# Patient Record
Sex: Male | Born: 1965 | Race: White | Hispanic: No | Marital: Single | Smoking: Never smoker
Health system: Southern US, Community
[De-identification: ages and names within clinical notes are randomized; demographics above are authoritative.]

## PROBLEM LIST (undated history)

## (undated) DIAGNOSIS — E119 Type 2 diabetes mellitus without complications: Secondary | ICD-10-CM

## (undated) HISTORY — PX: ABDOMINAL SURGERY: SHX537

---

## 2003-07-02 ENCOUNTER — Encounter: Admission: RE | Admit: 2003-07-02 | Discharge: 2003-07-02 | Payer: Self-pay | Admitting: Surgery

## 2005-07-28 ENCOUNTER — Ambulatory Visit: Payer: Self-pay | Admitting: Cardiovascular Disease

## 2005-07-28 ENCOUNTER — Observation Stay (HOSPITAL_COMMUNITY): Admission: EM | Admit: 2005-07-28 | Discharge: 2005-07-28 | Payer: Self-pay | Admitting: Emergency Medicine

## 2005-07-28 ENCOUNTER — Ambulatory Visit: Payer: Self-pay | Admitting: Family Medicine

## 2005-07-28 ENCOUNTER — Ambulatory Visit: Payer: Self-pay

## 2005-08-05 ENCOUNTER — Ambulatory Visit: Payer: Self-pay | Admitting: Family Medicine

## 2006-06-06 ENCOUNTER — Ambulatory Visit: Payer: Self-pay | Admitting: Family Medicine

## 2006-06-17 ENCOUNTER — Ambulatory Visit: Payer: Self-pay | Admitting: Family Medicine

## 2006-06-20 ENCOUNTER — Ambulatory Visit: Payer: Self-pay | Admitting: Family Medicine

## 2006-10-13 DIAGNOSIS — K449 Diaphragmatic hernia without obstruction or gangrene: Secondary | ICD-10-CM | POA: Insufficient documentation

## 2006-10-13 DIAGNOSIS — K219 Gastro-esophageal reflux disease without esophagitis: Secondary | ICD-10-CM

## 2006-10-13 DIAGNOSIS — E669 Obesity, unspecified: Secondary | ICD-10-CM | POA: Insufficient documentation

## 2006-10-13 DIAGNOSIS — I10 Essential (primary) hypertension: Secondary | ICD-10-CM

## 2006-10-13 DIAGNOSIS — F5232 Male orgasmic disorder: Secondary | ICD-10-CM

## 2007-07-12 ENCOUNTER — Ambulatory Visit: Payer: Self-pay | Admitting: Family Medicine

## 2007-07-12 DIAGNOSIS — G43909 Migraine, unspecified, not intractable, without status migrainosus: Secondary | ICD-10-CM | POA: Insufficient documentation

## 2007-07-17 ENCOUNTER — Encounter (INDEPENDENT_AMBULATORY_CARE_PROVIDER_SITE_OTHER): Payer: Self-pay | Admitting: *Deleted

## 2007-07-17 ENCOUNTER — Ambulatory Visit: Payer: Self-pay | Admitting: Family Medicine

## 2007-07-17 LAB — CONVERTED CEMR LAB
BUN: 22 mg/dL (ref 6–23)
CO2: 29 meq/L (ref 19–32)
Calcium: 9.6 mg/dL (ref 8.4–10.5)
Chloride: 97 meq/L (ref 96–112)
Creatinine, Ser: 1.05 mg/dL (ref 0.40–1.50)
Glucose, Bld: 95 mg/dL (ref 70–99)
Potassium: 4.4 meq/L (ref 3.5–5.3)
Sodium: 139 meq/L (ref 135–145)

## 2007-08-08 ENCOUNTER — Encounter (INDEPENDENT_AMBULATORY_CARE_PROVIDER_SITE_OTHER): Payer: Self-pay | Admitting: *Deleted

## 2007-08-08 ENCOUNTER — Ambulatory Visit: Payer: Self-pay | Admitting: Sports Medicine

## 2007-08-08 LAB — CONVERTED CEMR LAB
ALT: 31 units/L (ref 0–53)
AST: 16 units/L (ref 0–37)
Albumin: 4.7 g/dL (ref 3.5–5.2)
Alkaline Phosphatase: 91 units/L (ref 39–117)
BUN: 19 mg/dL (ref 6–23)
CO2: 28 meq/L (ref 19–32)
Calcium: 9.9 mg/dL (ref 8.4–10.5)
Chloride: 99 meq/L (ref 96–112)
Cholesterol: 207 mg/dL — ABNORMAL HIGH (ref 0–200)
Creatinine, Ser: 0.94 mg/dL (ref 0.40–1.50)
Glucose, Bld: 118 mg/dL — ABNORMAL HIGH (ref 70–99)
HDL: 55 mg/dL (ref >39)
LDL Cholesterol: 118 mg/dL — ABNORMAL HIGH (ref 0–99)
Potassium: 4.6 meq/L (ref 3.5–5.3)
Sodium: 139 meq/L (ref 135–145)
Total Bilirubin: 0.5 mg/dL (ref 0.3–1.2)
Total CHOL/HDL Ratio: 3.8
Total Protein: 7.5 g/dL (ref 6.0–8.3)
Triglycerides: 169 mg/dL — ABNORMAL HIGH (ref <150)
VLDL: 34 mg/dL (ref 0–40)

## 2007-08-14 ENCOUNTER — Encounter (INDEPENDENT_AMBULATORY_CARE_PROVIDER_SITE_OTHER): Payer: Self-pay | Admitting: *Deleted

## 2007-09-08 ENCOUNTER — Telehealth (INDEPENDENT_AMBULATORY_CARE_PROVIDER_SITE_OTHER): Payer: Self-pay | Admitting: *Deleted

## 2010-10-05 ENCOUNTER — Encounter: Payer: Self-pay | Admitting: *Deleted
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
## 2014-09-23 ENCOUNTER — Other Ambulatory Visit (INDEPENDENT_AMBULATORY_CARE_PROVIDER_SITE_OTHER): Payer: Self-pay | Admitting: Surgery

## 2014-09-23 ENCOUNTER — Other Ambulatory Visit (INDEPENDENT_AMBULATORY_CARE_PROVIDER_SITE_OTHER): Payer: Self-pay

## 2014-09-23 DIAGNOSIS — K439 Ventral hernia without obstruction or gangrene: Secondary | ICD-10-CM

## 2014-09-24 ENCOUNTER — Other Ambulatory Visit (INDEPENDENT_AMBULATORY_CARE_PROVIDER_SITE_OTHER): Payer: Self-pay | Admitting: Surgery

## 2014-09-24 DIAGNOSIS — R1084 Generalized abdominal pain: Secondary | ICD-10-CM

## 2014-09-25 ENCOUNTER — Other Ambulatory Visit (INDEPENDENT_AMBULATORY_CARE_PROVIDER_SITE_OTHER): Payer: Self-pay

## 2014-09-25 ENCOUNTER — Other Ambulatory Visit (INDEPENDENT_AMBULATORY_CARE_PROVIDER_SITE_OTHER): Payer: Self-pay | Admitting: *Deleted

## 2014-09-25 DIAGNOSIS — R1084 Generalized abdominal pain: Secondary | ICD-10-CM

## 2014-10-03 ENCOUNTER — Ambulatory Visit
Admission: RE | Admit: 2014-10-03 | Discharge: 2014-10-03 | Disposition: A | Discharge status: Home / Self Care | Payer: Self-pay | Source: Ambulatory Visit | Attending: Surgery | Admitting: Surgery

## 2014-10-11 ENCOUNTER — Other Ambulatory Visit (INDEPENDENT_AMBULATORY_CARE_PROVIDER_SITE_OTHER): Payer: Self-pay | Admitting: Surgery

## 2014-10-15 ENCOUNTER — Other Ambulatory Visit (INDEPENDENT_AMBULATORY_CARE_PROVIDER_SITE_OTHER): Payer: Self-pay | Admitting: *Deleted

## 2014-10-15 DIAGNOSIS — R109 Unspecified abdominal pain: Secondary | ICD-10-CM

## 2014-10-17 ENCOUNTER — Ambulatory Visit
Admission: RE | Admit: 2014-10-17 | Discharge: 2014-10-17 | Disposition: A | Discharge status: Home / Self Care | Payer: BLUE CROSS/BLUE SHIELD | Source: Ambulatory Visit | Attending: Surgery | Admitting: Surgery

## 2014-10-17 HISTORY — DX: Type 2 diabetes mellitus without complications: E11.9

## 2014-10-17 MED ORDER — IOPAMIDOL (ISOVUE-300) INJECTION 61%
100.0000 mL | Freq: Once | INTRAVENOUS | Status: AC | PRN
  Administered 2014-10-17: 100 mL via INTRAVENOUS

## 2014-11-04 ENCOUNTER — Other Ambulatory Visit: Payer: Self-pay | Admitting: Surgery

## 2016-10-14 ENCOUNTER — Encounter (HOSPITAL_COMMUNITY): Payer: Self-pay

## 2016-10-14 ENCOUNTER — Observation Stay (HOSPITAL_COMMUNITY)
Admission: EM | Admit: 2016-10-14 | Discharge: 2016-10-15 | Disposition: A | Discharge status: Home / Self Care | Payer: BLUE CROSS/BLUE SHIELD | Attending: Internal Medicine | Admitting: Internal Medicine

## 2016-10-14 ENCOUNTER — Other Ambulatory Visit: Payer: Self-pay

## 2016-10-14 ENCOUNTER — Emergency Department (HOSPITAL_COMMUNITY): Payer: BLUE CROSS/BLUE SHIELD

## 2016-10-14 DIAGNOSIS — R0789 Other chest pain: Principal | ICD-10-CM | POA: Insufficient documentation

## 2016-10-14 DIAGNOSIS — E119 Type 2 diabetes mellitus without complications: Secondary | ICD-10-CM | POA: Diagnosis not present

## 2016-10-14 DIAGNOSIS — R079 Chest pain, unspecified: Secondary | ICD-10-CM

## 2016-10-14 DIAGNOSIS — R609 Edema, unspecified: Secondary | ICD-10-CM

## 2016-10-14 LAB — GLUCOSE, CAPILLARY: GLUCOSE-CAPILLARY: 166 mg/dL — AB (ref 65–99)

## 2016-10-14 LAB — CBC
HCT: 42.2 % (ref 39.0–52.0)
Hemoglobin: 13.6 g/dL (ref 13.0–17.0)
MCH: 25.8 pg — ABNORMAL LOW (ref 26.0–34.0)
MCHC: 32.2 g/dL (ref 30.0–36.0)
MCV: 79.9 fL (ref 78.0–100.0)
Platelets: 200 10*3/uL (ref 150–400)
RBC: 5.28 MIL/uL (ref 4.22–5.81)
RDW: 14.9 % (ref 11.5–15.5)
WBC: 7.4 10*3/uL (ref 4.0–10.5)

## 2016-10-14 LAB — BASIC METABOLIC PANEL
ANION GAP: 7 (ref 5–15)
Anion gap: 12 (ref 5–15)
BUN: 11 mg/dL (ref 6–20)
BUN: 12 mg/dL (ref 6–20)
CALCIUM: 9.4 mg/dL (ref 8.9–10.3)
CALCIUM: 9.2 mg/dL (ref 8.9–10.3)
CHLORIDE: 102 mmol/L (ref 101–111)
CO2: 26 mmol/L (ref 22–32)
CO2: 28 mmol/L (ref 22–32)
CREATININE: 0.91 mg/dL (ref 0.61–1.24)
Chloride: 103 mmol/L (ref 101–111)
Creatinine, Ser: 0.91 mg/dL (ref 0.61–1.24)
GFR calc Af Amer: >60 mL/min (ref >60)
GFR calc non Af Amer: >60 mL/min (ref >60)
GFR calc non Af Amer: >60 mL/min (ref >60)
GLUCOSE: 115 mg/dL — AB (ref 65–99)
Glucose, Bld: 118 mg/dL — ABNORMAL HIGH (ref 65–99)
POTASSIUM: 3.8 mmol/L (ref 3.5–5.1)
Potassium: 3.8 mmol/L (ref 3.5–5.1)
SODIUM: 140 mmol/L (ref 135–145)
Sodium: 138 mmol/L (ref 135–145)

## 2016-10-14 LAB — I-STAT TROPONIN, ED
TROPONIN I, POC: 0 ng/mL (ref 0.00–0.08)
Troponin i, poc: 0 ng/mL (ref 0.00–0.08)

## 2016-10-14 LAB — D-DIMER, QUANTITATIVE: D-Dimer, Quant: 1.48 ug/mL-FEU — ABNORMAL HIGH (ref 0.00–0.50)

## 2016-10-14 LAB — TROPONIN I: Troponin I: <0.03 ng/mL (ref <0.03)

## 2016-10-14 LAB — TSH: TSH: 2.419 u[IU]/mL (ref 0.350–4.500)

## 2016-10-14 LAB — PROTIME-INR
INR: 1
Prothrombin Time: 13.2 seconds (ref 11.4–15.2)

## 2016-10-14 LAB — APTT: aPTT: 28 seconds (ref 24–36)

## 2016-10-14 MED ORDER — ACETAMINOPHEN 325 MG PO TABS: 650.0000 mg | ORAL_TABLET | ORAL | Status: DC | PRN

## 2016-10-14 MED ORDER — INSULIN ASPART 100 UNIT/ML ~~LOC~~ SOLN: 0.0000 [IU] | Freq: Three times a day (TID) | SUBCUTANEOUS | Status: DC

## 2016-10-14 MED ORDER — ASPIRIN 81 MG PO CHEW
81.0000 mg | CHEWABLE_TABLET | Freq: Every day | ORAL | Status: DC
  Administered 2016-10-15: 81 mg via ORAL
  Filled 2016-10-14: qty 1

## 2016-10-14 MED ORDER — ENOXAPARIN SODIUM 40 MG/0.4ML ~~LOC~~ SOLN
40.0000 mg | SUBCUTANEOUS | Status: DC
  Administered 2016-10-14: 40 mg via SUBCUTANEOUS
  Filled 2016-10-14: qty 0.4

## 2016-10-14 MED ORDER — ASPIRIN 81 MG PO CHEW
324.0000 mg | CHEWABLE_TABLET | Freq: Once | ORAL | Status: AC
  Administered 2016-10-14: 324 mg via ORAL
  Filled 2016-10-14: qty 4

## 2016-10-14 MED ORDER — PANTOPRAZOLE SODIUM 40 MG PO TBEC
40.0000 mg | DELAYED_RELEASE_TABLET | Freq: Every day | ORAL | Status: DC
  Administered 2016-10-15: 40 mg via ORAL
  Filled 2016-10-14: qty 1

## 2016-10-14 MED ORDER — NITROGLYCERIN 0.4 MG SL SUBL: 0.4000 mg | SUBLINGUAL_TABLET | SUBLINGUAL | Status: DC | PRN

## 2016-10-14 NOTE — ED Triage Notes (Signed)
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 Pt rates the pain a 3/10. Pt took 2 ASA at 8 am and 1 Nitro prior to ems arrival. Pt denies any SOB, dizziness or nausea. VSS NAD at this time.

## 2016-10-14 NOTE — ED Provider Notes (Signed)
[facility]EMERGENCY DEPT Provider Note   CSN: [id number] Arrival date & time: 10/14/16  1522     History   Chief Complaint Chief Complaint  Patient presents with  . Chest Pain    HPI Gregory Glover is a 51 y.o. male.  HPI Patient presents to the emergency room with complaints of chest pain. Patient was at work this morning. He started developing pain in the left side of his chest about 8 AM. It radiated to his left arm. It lasted for several hours this morning and then gradually resolved. Patient did have some nausea with it. She denied feeling short of breath or diaphoretic.  he states she's not having any pain anymore although does have some pressure-type discomfort in his chest.  No trouble with any leg swelling. No history of PE or DVT. No history of coronary artery disease. He does not smoke. Past Medical History:  Diagnosis Date  . Diabetes [facility])     Patient Active Problem List   Diagnosis Date Noted  . MIGRAINE UNSP W/O INTRACT W/O STATUS MIGRAINOSUS 07/12/2007  . OBESITY, NOS 10/13/2006  . ERECTILE DYSFUNCTION 10/13/2006  . HYPERTENSION, BENIGN SYSTEMIC 10/13/2006  . GASTROESOPHAGEAL REFLUX, NO ESOPHAGITIS 10/13/2006  . HERNIA, HIATAL, NONCONGENITAL 10/13/2006    Past Surgical History:  Procedure Laterality Date  . ABDOMINAL SURGERY     "back in the 90s"       Home Medications    Prior to Admission medications   Medication Sig Start Date End Date Taking? Authorizing Provider  diclofenac (VOLTAREN) 75 MG EC tablet Take 75 mg by mouth 2 (two) times daily as needed.      Historical Provider, MD  lisinopril-hydrochlorothiazide (PRINZIDE,ZESTORETIC) 20-12.5 MG per tablet Take 1 tablet by mouth daily.      Historical Provider, MD  pantoprazole (PROTONIX) 40 MG tablet Take 40 mg by mouth daily.      Historical Provider, MD  SUMAtriptan (IMITREX) 100 MG tablet Take 100 mg by mouth every 2 (two) hours as needed. Max 200 mg     Historical Provider, MD    Family  History No family history on file.  Social History Social History  Substance Use Topics  . Smoking status: Never Smoker  . Smokeless tobacco: Never Used  . Alcohol use Yes     Comment: on the weekends     Allergies   Codeine   Review of Systems Review of Systems  All other systems reviewed and are negative.    Physical Exam Updated Vital Signs BP 144/93 (BP Location: Right Arm)   Pulse 84   Temp 97.9 F (36.6 C) (Oral)   Resp 20   Ht 6\' 3"  (1.905 m)   Wt 136.1 kg   SpO2 99%   BMI 37.50 kg/m   Physical Exam  Constitutional: He appears well-developed and well-nourished. No distress.  HENT:  Head: Normocephalic and atraumatic.  Right Ear: External ear normal.  Left Ear: External ear normal.  Eyes: Conjunctivae are normal. Right eye exhibits no discharge. Left eye exhibits no discharge. No scleral icterus.  Neck: Neck supple. No tracheal deviation present.  Cardiovascular: Normal rate, regular rhythm and intact distal pulses.   Pulmonary/Chest: Effort normal and breath sounds normal. No stridor. No respiratory distress. He has no wheezes. He has no rales.  Abdominal: Soft. Bowel sounds are normal. He exhibits no distension. There is no tenderness. There is no rebound and no guarding.  Musculoskeletal: He exhibits no edema or tenderness.  Neurological: He is  alert. He has normal strength. No cranial nerve deficit (no facial droop, extraocular movements intact, no slurred speech) or sensory deficit. He exhibits normal muscle tone. He displays no seizure activity. Coordination normal.  Skin: Skin is warm and dry. No rash noted.  Psychiatric: He has a normal mood and affect.  Nursing note and vitals reviewed.    ED Treatments / Results  Labs (all labs ordered are listed, but only abnormal results are displayed) Labs Reviewed  CBC - Abnormal; Notable for the following:       Result Value   MCH 25.8 (*)    All other components within normal limits  PROTIME-INR    APTT  BASIC METABOLIC PANEL  D-DIMER, QUANTITATIVE (NOT AT [facility])  Rosezena SensorI-STAT TROPOININ, ED    EKG  EKG Interpretation  Date/Time:  Thursday October 14 2016 15:23:41 EST Ventricular Rate:  81 PR Interval:  122 QRS Duration: 98 QT Interval:  374 QTC Calculation: 434 R Axis:   104 Text Interpretation:  Normal sinus rhythm Rightward axis T wave abnormality, consider inferior ischemia Abnormal ECG T wave changes in aVF and III are new compared to last tracing Confirmed by Leeta Grimme  MD-J, Vertis Bauder ([id number]) on 10/14/2016 3:30:20 PM       Radiology Dg Chest 2 View  Result Date: 10/14/2016 CLINICAL DATA:  Central chest pain radiating to the left shoulder for several hours EXAM: CHEST  2 VIEW COMPARISON:  07/27/2005 FINDINGS: Cardiac shadow is stable. Persistent scarring is noted in the left lung base. Multiple calcified granulomas are again seen and stable. No focal infiltrate or sizable effusion is noted. No bony abnormality is seen. IMPRESSION: Chronic changes as described above without acute abnormality. Electronically Signed   By: Alcide CleverMark  Lukens M.D.   On: 10/14/2016 15:55    Procedures Procedures (including critical care time)  Medications Ordered in ED Medications  nitroGLYCERIN (NITROSTAT) SL tablet 0.4 mg (not administered)  aspirin chewable tablet 324 mg (324 mg Oral Given 10/14/16 1634)     Initial Impression / Assessment and Plan / ED Course  I have reviewed the triage vital signs and the nursing notes.  Pertinent labs & imaging results that were available during my care of the patient were reviewed by me and considered in my medical decision making (see chart for details).  Clinical Course as of Oct 15 1707  Thu Oct 14, 2016  1708 D/w Dr Blake DivineAkula.  Will admit for cardiac rule out  [JK]    Clinical Course User Index [JK] Linwood DibblesJon Zaidy Absher, MD    Pt presents to the ED With chest pain concerning for unstable angina. Patient does have diabetes and high cholesterol. He has EKG changes that are new  compared to old EKG on file from 2006. Plan on admission to hospital for cardiac rule out.   Final Clinical Impressions(s) / ED Diagnoses   Final diagnoses:  Chest pain, unspecified type    New Prescriptions New Prescriptions   No medications on file     Linwood DibblesJon Remell Giaimo, MD 10/14/16 1710

## 2016-10-14 NOTE — H&P (Signed)
History and Physical    Gregory Glover ZOX:[id number] DOB: 10-Sep-1965 DOA: 10/14/2016  PCP: Vivien Presto, MD  Patient coming from: Home.   I have personally briefly reviewed patient's old medical records in [facility]  Chief Complaint: left sided chest pain.   HPI: Gregory Glover is a 51 y.o. male with medical history significant of diabetes mellitus and hyperlipidemia, presents to ED for left sided chest pain, while he was working earlier this am,. The pain is intermittent , radiated to the left arm, resolved after taking 2 aspirin. He reports nausea and some dizziness. He also reports intermittent tingling of both hands. He denies sob, cough, fever and chills. He reports similar complaints in the past but not as severe as this episode. He denies any other complaints. No headache, abdominal pain or diarrhea. On arrival to ED, he underwent cbc, CXR which were unremarkable.  BMP is pending. Point of care troponin is negative.  He was referred to medical service for evaluation of ACS, in view of his h/o of DM and hyperlipidemia.  Review of Systems: As per HPI otherwise 10 point review of systems negative.    Past Medical History:  Diagnosis Date  . Diabetes [facility])     Past Surgical History:  Procedure Laterality Date  . ABDOMINAL SURGERY     "back in the 90s"     reports that he has never smoked. He has never used smokeless tobacco. He reports that he drinks alcohol. He reports that he does not use drugs.  Allergies  Allergen Reactions  . Codeine     Shakes and nervousness    Family history of stroke  And CAD, AND arrhythmias in father at the age of 55's.   Prior to Admission medications   Medication Sig Start Date End Date Taking? Authorizing Provider  amLODipine (NORVASC) 10 MG tablet Take 10 mg by mouth daily.   Yes Historical Provider, MD  metFORMIN (GLUCOPHAGE) 1000 MG tablet Take 1,000 mg by mouth 2 (two) times daily with a meal.   Yes Historical  Provider, MD  pantoprazole (PROTONIX) 40 MG tablet Take 40 mg by mouth daily.     Yes Historical Provider, MD    Physical Exam: Vitals:   10/14/16 1522 10/14/16 1526 10/14/16 1532 10/14/16 1717  BP:   144/93 142/94  Pulse:   84 77  Resp:   20 20  Temp:   97.9 F (36.6 C)   TempSrc:   Oral   SpO2: 96%  99% 97%  Weight:  136.1 kg (300 lb)    Height:  6\' 3"  (1.905 m)      Constitutional: NAD, calm, comfortable Vitals:   10/14/16 1522 10/14/16 1526 10/14/16 1532 10/14/16 1717  BP:   144/93 142/94  Pulse:   84 77  Resp:   20 20  Temp:   97.9 F (36.6 C)   TempSrc:   Oral   SpO2: 96%  99% 97%  Weight:  136.1 kg (300 lb)    Height:  6\' 3"  (1.905 m)     Eyes: PERRL, lids and conjunctivae normal ENMT: Mucous membranes are moist. Posterior pharynx clear of any exudate or lesions.Normal dentition.  Neck: normal, supple, no masses, no thyromegaly Respiratory: clear to auscultation bilaterally, no wheezing, no crackles. Normal respiratory effort. No accessory muscle use.  Cardiovascular: Regular rate and rhythm, no murmurs / rubs / gallops. No extremity edema. 2+ pedal pulses. No carotid bruits.  Abdomen: no tenderness, no masses palpated. No  hepatosplenomegaly. Bowel sounds positive.  Musculoskeletal: no clubbing / cyanosis. No joint deformity upper and lower extremities. Good ROM, no contractures. Normal muscle tone.  Skin: no rashes, lesions, ulcers. No induration Neurologic: CN 2-12 grossly intact. Sensation intact, DTR normal. Strength 5/5 in all 4.  Psychiatric: Normal judgment and insight. Alert and oriented x 3. Normal mood.     Labs on Admission: I have personally reviewed following labs and imaging studies  CBC:  Recent Labs Lab 10/14/16 1620  WBC 7.4  HGB 13.6  HCT 42.2  MCV 79.9  PLT 200   Basic Metabolic Panel: No results for input(s): NA, K, CL, CO2, GLUCOSE, BUN, CREATININE, CALCIUM, MG, PHOS in the last 168 hours. GFR: CrCl cannot be calculated  (Patient's most recent lab result is older than the maximum 21 days allowed.). Liver Function Tests: No results for input(s): AST, ALT, ALKPHOS, BILITOT, PROT, ALBUMIN in the last 168 hours. No results for input(s): LIPASE, AMYLASE in the last 168 hours. No results for input(s): AMMONIA in the last 168 hours. Coagulation Profile:  Recent Labs Lab 10/14/16 1620  INR 1.00   Cardiac Enzymes: No results for input(s): CKTOTAL, CKMB, CKMBINDEX, TROPONINI in the last 168 hours. BNP (last 3 results) No results for input(s): PROBNP in the last 8760 hours. HbA1C: No results for input(s): HGBA1C in the last 72 hours. CBG: No results for input(s): GLUCAP in the last 168 hours. Lipid Profile: No results for input(s): CHOL, HDL, LDLCALC, TRIG, CHOLHDL, LDLDIRECT in the last 72 hours. Thyroid Function Tests: No results for input(s): TSH, T4TOTAL, FREET4, T3FREE, THYROIDAB in the last 72 hours. Anemia Panel: No results for input(s): VITAMINB12, FOLATE, FERRITIN, TIBC, IRON, RETICCTPCT in the last 72 hours. Urine analysis: No results found for: COLORURINE, APPEARANCEUR, LABSPEC, PHURINE, GLUCOSEU, HGBUR, BILIRUBINUR, KETONESUR, PROTEINUR, UROBILINOGEN, NITRITE, LEUKOCYTESUR  Radiological Exams on Admission: Dg Chest 2 View  Result Date: 10/14/2016 CLINICAL DATA:  Central chest pain radiating to the left shoulder for several hours EXAM: CHEST  2 VIEW COMPARISON:  07/27/2005 FINDINGS: Cardiac shadow is stable. Persistent scarring is noted in the left lung base. Multiple calcified granulomas are again seen and stable. No focal infiltrate or sizable effusion is noted. No bony abnormality is seen. IMPRESSION: Chronic changes as described above without acute abnormality. Electronically Signed   By: Alcide CleverMark  Lukens M.D.   On: 10/14/2016 15:55    EKG: sinus rhythm with t wave inversions in lead III and avf.   Assessment/Plan Active Problems:   Chest pain   Left sided chest pain with left shoulder  numbness : Admitted to telemetry for evaluation of ACS.  Serial enzymes, follow up EKG. Echocardiogram ordered.  Cardiology consult to see if he needs a stress test.  Lipid panel in am.   Diabetes Mellitus: CBG (last 3)  No results for input(s): GLUCAP in the last 72 hours.  Start SSI.  Hgba1c,  Hold metformin.   Hyperlipidemia:  Get lipid panel in am   GERD: Continue with protonix.    DVT prophylaxis: lovenox.  Code Status: full code.  Family Communication: family at bedside.  Disposition Plan: pending further eval.  Consults called: cardiology consult in am. Admission status: obs telemetry   Kamauri Kathol MD [facility] Pager [phone]  If 7PM-7AM, please contact night-coverage [email or web address] Password [email or web address]  10/14/2016, 7:14 PM

## 2016-10-15 ENCOUNTER — Observation Stay (HOSPITAL_BASED_OUTPATIENT_CLINIC_OR_DEPARTMENT_OTHER): Payer: BLUE CROSS/BLUE SHIELD

## 2016-10-15 ENCOUNTER — Observation Stay (HOSPITAL_COMMUNITY): Payer: BLUE CROSS/BLUE SHIELD

## 2016-10-15 DIAGNOSIS — R079 Chest pain, unspecified: Secondary | ICD-10-CM

## 2016-10-15 DIAGNOSIS — R609 Edema, unspecified: Secondary | ICD-10-CM | POA: Diagnosis not present

## 2016-10-15 DIAGNOSIS — R072 Precordial pain: Secondary | ICD-10-CM | POA: Diagnosis not present

## 2016-10-15 DIAGNOSIS — R0789 Other chest pain: Secondary | ICD-10-CM

## 2016-10-15 LAB — ECHOCARDIOGRAM COMPLETE
AV Area VTI: 4.82 cm2
AV Mean grad: 3 mmHg
AV Peak grad: 6 mmHg
AV area mean vel ind: 1.94 cm2/m2
AV peak Index: 1.87
AV vel: 4.99
AVA: 4.99 cm2
AVAREAMEANV: 5 cm2
AVAREAVTIIND: 1.93 cm2/m2
AVCELMEANRAT: 0.87
AVPKVEL: 118 cm/s
Ao pk vel: 0.84 m/s
CHL CUP MV DEC (S): 208
DOP CAL AO MEAN VELOCITY: 75.4 cm/s
E/e' ratio: 7.27
EWDT: 208 ms
FS: 39 % (ref 28–44)
Height: 75 in
IV/PV OW: 1.1
LA ID, A-P, ES: 41 mm
LA diam end sys: 41 mm
LA diam index: 1.59 cm/m2
LA vol: 49.9 mL
LAVOLA4C: 41.9 mL
LAVOLIN: 19.3 mL/m2
LV E/e' medial: 7.27
LV E/e'average: 7.27
LV PW d: 12.1 mm — AB (ref 0.6–1.1)
LV TDI E'LATERAL: 10.6
LV TDI E'MEDIAL: 8.18
LV e' LATERAL: 10.6 cm/s
LVOT VTI: 20.3 cm
LVOT area: 5.73 cm2
LVOTD: 27 mm
LVOTPV: 99.2 cm/s
LVOTSV: 116 mL
LVOTVTI: 0.87 cm
MV pk A vel: 76 m/s
MV pk E vel: 77.1 m/s
MVPG: 2 mmHg
RV LATERAL S' VELOCITY: 11 cm/s
TAPSE: 15.3 mm
VTI: 23.3 cm
Valve area index: 1.93
Weight: 4694.4 oz

## 2016-10-15 LAB — NM MYOCAR MULTI W/SPECT W/WALL MOTION / EF
CHL CUP RESTING HR STRESS: 74 {beats}/min
LHR: 0
LVDIAVOL: 124 mL (ref 62–150)
LVSYSVOL: 62 mL
Peak HR: 115 {beats}/min
SDS: 4
SRS: 7
SSS: 11
TID: 1.12

## 2016-10-15 LAB — GLUCOSE, CAPILLARY
GLUCOSE-CAPILLARY: 144 mg/dL — AB (ref 65–99)
GLUCOSE-CAPILLARY: 168 mg/dL — AB (ref 65–99)

## 2016-10-15 LAB — TROPONIN I

## 2016-10-15 LAB — HIV ANTIBODY (ROUTINE TESTING W REFLEX): HIV SCREEN 4TH GENERATION: NONREACTIVE

## 2016-10-15 MED ORDER — IOPAMIDOL (ISOVUE-370) INJECTION 76%
INTRAVENOUS | Status: AC
  Administered 2016-10-15: 100 mL
  Filled 2016-10-15: qty 100

## 2016-10-15 MED ORDER — TECHNETIUM TC 99M TETROFOSMIN IV KIT
30.0000 | PACK | Freq: Once | INTRAVENOUS | Status: AC | PRN
  Administered 2016-10-15: 30 via INTRAVENOUS

## 2016-10-15 MED ORDER — ASPIRIN 81 MG PO CHEW: 81.0000 mg | CHEWABLE_TABLET | Freq: Every day | ORAL | Status: AC

## 2016-10-15 MED ORDER — SODIUM CHLORIDE 0.9 % IV SOLN
INTRAVENOUS | Status: DC
  Administered 2016-10-15: 17:00:00 via INTRAVENOUS

## 2016-10-15 MED ORDER — PERFLUTREN LIPID MICROSPHERE
1.0000 mL | INTRAVENOUS | Status: AC | PRN
  Administered 2016-10-15: 2 mL via INTRAVENOUS
  Filled 2016-10-15: qty 10

## 2016-10-15 MED ORDER — TECHNETIUM TC 99M TETROFOSMIN IV KIT
10.0000 | PACK | Freq: Once | INTRAVENOUS | Status: AC | PRN
  Administered 2016-10-15: 10 via INTRAVENOUS

## 2016-10-15 MED ORDER — REGADENOSON 0.4 MG/5ML IV SOLN
INTRAVENOUS | Status: AC
Start: 2016-10-15 — End: 2016-10-15
  Administered 2016-10-15: 0.4 mg via INTRAVENOUS
  Filled 2016-10-15: qty 5

## 2016-10-15 MED ORDER — REGADENOSON 0.4 MG/5ML IV SOLN
0.4000 mg | Freq: Once | INTRAVENOUS | Status: AC
  Administered 2016-10-15: 0.4 mg via INTRAVENOUS
  Filled 2016-10-15: qty 5

## 2016-10-15 MED ORDER — METFORMIN HCL 1000 MG PO TABS: 1000.0000 mg | ORAL_TABLET | Freq: Two times a day (BID) | ORAL | Status: AC

## 2016-10-15 NOTE — Progress Notes (Signed)
Nuc result reviewed. Per Dr. Royann Shiversroitoru, Low risk stress nuclear study with normal perfusion and low normal left ventricular global systolic function. EF reported at 50% but he states there were some processing issues to obtain this and suggested corroboration of LVEF with an alternative method (e.g. Echo). 2D echo showed mild LVH, EF 55-60%, grade 1 DD, otherwise unremarkable. Results relayed to patient. Recommend primary prevention with BP control, lifestyle modification. IM plans to w/u d-dimer with CTA. No further cardiac workup recommended at this time per review of the above by Dr. Antoine PocheHochrein. Gregory Kassabian PA-C

## 2016-10-15 NOTE — Progress Notes (Signed)
Patient seen and examined earlier this morning. Atypical chest pain-negative enzymes, seen by cardiology with plans for stress testing today If stress test negative, will discharge home later today.

## 2016-10-15 NOTE — Discharge Summary (Signed)
PATIENT DETAILS Name: Gregory Glover Age: 51 y.o. Sex: male Date of Birth: 08/17/1965 MRN: [id number]. Admitting Physician: Kathlen Mody, Glover Gregory Glover  Admit Date: 10/14/2016 Discharge date: 10/15/2016  Recommendations for Outpatient Follow-up:  1. Follow up with PCP in 1-2 weeks 2. Please obtain BMP/CBC in one week 3. Repeat CT chest in 3-6 months to reassess resolution of enlarged hilar lymphnodes  Admitted From:  Home  Disposition: Home   Home Health: No  Equipment/Devices: None  Discharge Condition: Stable  CODE STATUS: FULL CODE  Diet recommendation:  Heart Healthy / Carb Modified   Brief Summary: See H&P, Labs, Consult and Test reports for all details in brief, patient is a 51 year old male with history of hypertension, diabetes was admitted for evaluation of chest pain  Brief Hospital Course: Atypical chest pain: No further chest pain since admission, troponins negative. Underwent nuclear stress test which was negative. D Dimer on admission was also significantly elevated-hence a CT Angio chest was done-which was also negative.No further recommendations from cardiology.  Hypertension: Controlled, resume amlodipine on discharge  Type 2 diabetes: CBGs stable, resume metformin >48 hours post discharge.  Calcified right pulmonary nodule and right hilar nodes: seen incidentally on CT Chest-findings were discussed with patient, with recommendations to pursue outpatient work up and will likely need a repeat CT chest in 3-6 months to reassess resolution of enlarged hilar lymph node. He was asked to discuss these findings with his PCP at his next follow up visit.  Procedures/Studies: Lexiscan>>negative TTE 3/2>>I - Technically difficult; definity used; normal LV systolic   function; grade 1 diastolic dysfunction; mild LVH.  Discharge Diagnoses:  Active Problems:   Chest pain  Discharge Instructions:  Activity:  As tolerated     Discharge Instructions    Diet - low sodium heart healthy    Complete by:  As directed    Diet Carb Modified    Complete by:  As directed    Discharge instructions    Complete by:  As directed    Follow with Primary Glover  Gregory Glover in 1 week  Please get a complete blood count and chemistry panel checked by your Primary Glover at your next visit, and again as instructed by your Primary Glover.  Get Medicines reviewed and adjusted: Please take all your medications with you for your next visit with your Primary Glover  Laboratory/radiological data: Please request your Primary Glover to go over all hospital tests and procedure/radiological results at the follow up, please ask your Primary Glover to get all Hospital records sent to his/her office.  In some cases, they will be blood work, cultures and biopsy results pending at the time of your discharge. Please request that your primary care M.D. follows up on these results.  Also Note the following: If you experience worsening of your admission symptoms, develop shortness of breath, life threatening emergency, suicidal or homicidal thoughts you must seek medical attention immediately by calling 911 or calling your Glover immediately  if symptoms less severe.  You must read complete instructions/literature along with all the possible adverse reactions/side effects for all the Medicines you take and that have been prescribed to you. Take any new Medicines after you have completely understood and accpet all the possible adverse reactions/side effects.   Do not drive when taking Pain medications or sleeping medications (Benzodaizepines)  Do not take more than prescribed Pain, Sleep and Anxiety Medications. It is not advisable to combine anxiety,sleep and pain medications without talking  with your primary care practitioner  Special Instructions: If you have smoked or chewed Tobacco  in the last 2 yrs please stop smoking, stop any regular Alcohol  and or any  Recreational drug use.  Wear Seat belts while driving.  Please note: You were cared for by a hospitalist during your hospital stay. Once you are discharged, your primary care physician will handle any further medical issues. Please note that NO REFILLS for any discharge medications will be authorized once you are discharged, as it is imperative that you return to your primary care physician (or establish a relationship with a primary care physician if you do not have one) for your post hospital discharge needs so that they can reassess your need for medications and monitor your lab values.   Increase activity slowly    Complete by:  As directed      Allergies as of 10/15/2016      Reactions   Codeine    Shakes and nervousness      Medication List    TAKE these medications   amLODipine 10 MG tablet Commonly known as:  NORVASC Take 10 mg by mouth daily.   aspirin 81 MG chewable tablet Chew 1 tablet (81 mg total) by mouth daily. Start taking on:  10/16/2016   metFORMIN 1000 MG tablet Commonly known as:  GLUCOPHAGE Take 1 tablet (1,000 mg total) by mouth 2 (two) times daily with a meal. Start taking on:  10/17/2016   pantoprazole 40 MG tablet Commonly known as:  PROTONIX Take 40 mg by mouth daily.      Follow-up Information    Gregory Glover. Schedule an appointment as soon as possible for a visit in 1 week(s).   Specialty:  Family Medicine Contact information: [address] [place] [address] [phone]          Allergies  Allergen Reactions  . Codeine     Shakes and nervousness    Consultations:   cardiology  Other Procedures/Studies: Dg Chest 2 View  Result Date: 10/14/2016 CLINICAL DATA:  Central chest pain radiating to the left shoulder for several hours EXAM: CHEST  2 VIEW COMPARISON:  07/27/2005 FINDINGS: Cardiac shadow is stable. Persistent scarring is noted in the left lung base. Multiple calcified granulomas are again seen and stable.  No focal infiltrate or sizable effusion is noted. No bony abnormality is seen. IMPRESSION: Chronic changes as described above without acute abnormality. Electronically Signed   By: Alcide CleverMark  Lukens M.D.   On: 10/14/2016 15:55      TODAY-DAY OF DISCHARGE:  Subjective:   Gregory LimboWilliam Miltner today has no headache,no chest abdominal pain,no new weakness tingling or numbness, feels much better wants to go home today.   Objective:   Blood pressure 138/74, pulse 69, temperature 97.6 F (36.4 C), temperature source Oral, resp. rate 20, height 6\' 3"  (1.905 m), weight 133.1 kg (293 lb 6.4 oz), SpO2 98 %.  Intake/Output Summary (Last 24 hours) at 10/15/16 1324 Last data filed at 10/15/16 0800  Gross per 24 hour  Intake              180 ml  Output              250 ml  Net              -70 ml   Filed Weights   10/14/16 1526 10/14/16 2010 10/15/16 0300  Weight: 136.1 kg (300 lb) 133.4 kg (294 lb) 133.1 kg (  293 lb 6.4 oz)    Exam: Awake Alert, Oriented *3, No new F.N deficits, Normal affect [place].AT,PERRAL Supple Neck,No JVD, No cervical lymphadenopathy appriciated.  Symmetrical Chest wall movement, Good air movement bilaterally, CTAB RRR,No Gallops,Rubs or new Murmurs, No Parasternal Heave +ve B.Sounds, Abd Soft, Non tender, No organomegaly appriciated, No rebound -guarding or rigidity. No Cyanosis, Clubbing or edema, No new Rash or bruise   PERTINENT RADIOLOGIC STUDIES: Dg Chest 2 View  Result Date: 10/14/2016 CLINICAL DATA:  Central chest pain radiating to the left shoulder for several hours EXAM: CHEST  2 VIEW COMPARISON:  07/27/2005 FINDINGS: Cardiac shadow is stable. Persistent scarring is noted in the left lung base. Multiple calcified granulomas are again seen and stable. No focal infiltrate or sizable effusion is noted. No bony abnormality is seen. IMPRESSION: Chronic changes as described above without acute abnormality. Electronically Signed   By: Alcide Clever M.D.   On: 10/14/2016 15:55       PERTINENT LAB RESULTS: CBC:  Recent Labs  10/14/16 1620  WBC 7.4  HGB 13.6  HCT 42.2  PLT 200   CMET CMP     Component Value Date/Time   NA 138 10/14/2016 2027   K 3.8 10/14/2016 2027   CL 103 10/14/2016 2027   CO2 28 10/14/2016 2027   GLUCOSE 115 (H) 10/14/2016 2027   BUN 12 10/14/2016 2027   CREATININE 0.91 10/14/2016 2027   CALCIUM 9.2 10/14/2016 2027   PROT 7.5 08/08/2007 2009   ALBUMIN 4.7 08/08/2007 2009   AST 16 08/08/2007 2009   ALT 31 08/08/2007 2009   ALKPHOS 91 08/08/2007 2009   BILITOT 0.5 08/08/2007 2009   GFRNONAA >60 10/14/2016 2027   GFRAA >60 10/14/2016 2027    GFR Estimated Creatinine Clearance: 141.1 mL/min (by C-G formula based on SCr of 0.91 mg/dL). No results for input(s): LIPASE, AMYLASE in the last 72 hours.  Recent Labs  10/14/16 2028 10/15/16 0205 10/15/16 0842  TROPONINI <0.03 <0.03 <0.03   Invalid input(s): POCBNP  Recent Labs  10/14/16 1620  DDIMER 1.48*   No results for input(s): HGBA1C in the last 72 hours. No results for input(s): CHOL, HDL, LDLCALC, TRIG, CHOLHDL, LDLDIRECT in the last 72 hours.  Recent Labs  10/14/16 2028  TSH 2.419   No results for input(s): VITAMINB12, FOLATE, FERRITIN, TIBC, IRON, RETICCTPCT in the last 72 hours. Coags:  Recent Labs  10/14/16 1620  INR 1.00   Microbiology: No results found for this or any previous visit (from the past 240 hour(s)).  FURTHER DISCHARGE INSTRUCTIONS:  Get Medicines reviewed and adjusted: Please take all your medications with you for your next visit with your Primary Glover  Laboratory/radiological data: Please request your Primary Glover to go over all hospital tests and procedure/radiological results at the follow up, please ask your Primary Glover to get all Hospital records sent to his/her office.  In some cases, they will be blood work, cultures and biopsy results pending at the time of your discharge. Please request that your primary care M.D. goes  through all the records of your hospital data and follows up on these results.  Also Note the following: If you experience worsening of your admission symptoms, develop shortness of breath, life threatening emergency, suicidal or homicidal thoughts you must seek medical attention immediately by calling 911 or calling your Glover immediately  if symptoms less severe.  You must read complete instructions/literature along with all the possible adverse reactions/side effects for all the Medicines you  take and that have been prescribed to you. Take any new Medicines after you have completely understood and accpet all the possible adverse reactions/side effects.   Do not drive when taking Pain medications or sleeping medications (Benzodaizepines)  Do not take more than prescribed Pain, Sleep and Anxiety Medications. It is not advisable to combine anxiety,sleep and pain medications without talking with your primary care practitioner  Special Instructions: If you have smoked or chewed Tobacco  in the last 2 yrs please stop smoking, stop any regular Alcohol  and or any Recreational drug use.  Wear Seat belts while driving.  Please note: You were cared for by a hospitalist during your hospital stay. Once you are discharged, your primary care physician will handle any further medical issues. Please note that NO REFILLS for any discharge medications will be authorized once you are discharged, as it is imperative that you return to your primary care physician (or establish a relationship with a primary care physician if you do not have one) for your post hospital discharge needs so that they can reassess your need for medications and monitor your lab values.  Total Time spent coordinating discharge including counseling, education and face to face time equals 25 minutes.  SignedJeoffrey Massed 10/15/2016 1:24 PM
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
## 2016-10-15 NOTE — Progress Notes (Signed)
[facility] [place], she stated that there is no contraindication for receiving CT contrast and stress test contrast in the same day.  I relayed this to Dr Jerral RalphGhimire.

## 2016-10-15 NOTE — Progress Notes (Signed)
**  Preliminary report by tech**  Bilateral lower extremity venous duplex completed. There is no evidence of deep or superficial vein thrombosis involving the right and left lower extremities. All visualized vessels appear patent and compressible. There is no evidence of Baker's cysts bilaterally.  10/15/16 3:24 PM Olen CordialGreg Metta Koranda RVT

## 2016-10-15 NOTE — Progress Notes (Signed)
   Nida BoatmanWilliam E Boss presented for a nuclear stress test today.  No immediate complications.  Stress imaging is pending at this time.  Preliminary EKG findings may be listed in the chart, but the stress test result will not be finalized until perfusion imaging is complete.  Laurann Montanaayna N Dunn, PA-C 10/15/2016, 11:58 AM

## 2016-10-16 LAB — HEMOGLOBIN A1C
Hgb A1c MFr Bld: 7.2 % — ABNORMAL HIGH (ref 4.8–5.6)
MEAN PLASMA GLUCOSE: 160 mg/dL

## 2018-01-11 IMAGING — CT CT ANGIO CHEST
2 of 5 series · 19 of 36 positions shown · IV contrast (isovue)
Comparison: None.

CLINICAL DATA: Central chest pain beginning yesterday. Initial
encounter.

EXAM:
CT ANGIOGRAPHY CHEST WITH CONTRAST
TECHNIQUE: Multidetector CT imaging of the chest was performed using the
standard protocol during bolus administration of intravenous
contrast. Multiplanar CT image reconstructions and MIPs were
obtained to evaluate the vascular anatomy.
CONTRAST:  54 mL Isovue 370

[Series 4: pe 3mm · axial · 0.86mm/px · z∈[+1195,+1417]mm · 18 of 80 slices shown]
[im 3/80  lung]
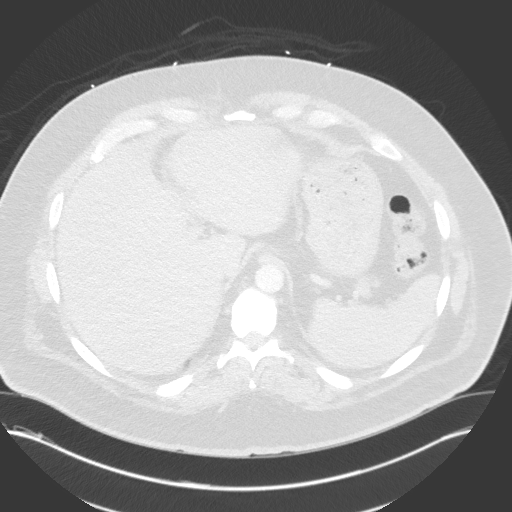
[im 9/80  mediastinal]
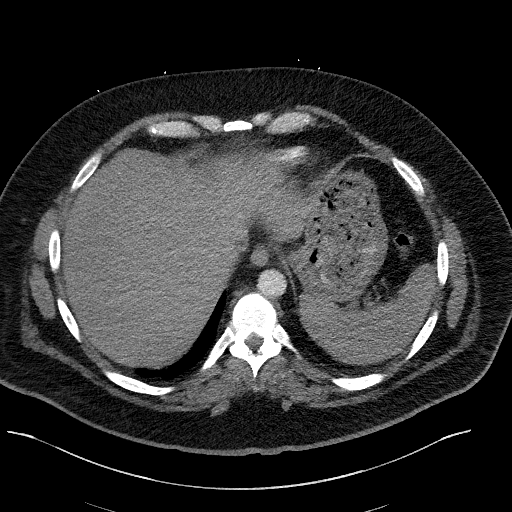
[im 12/80  lung]
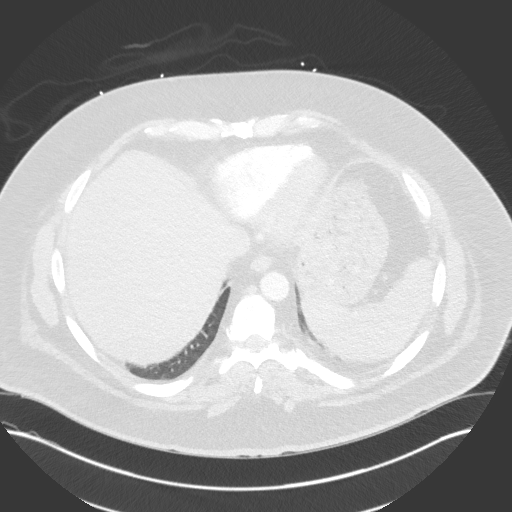
[im 18/80  mediastinal]
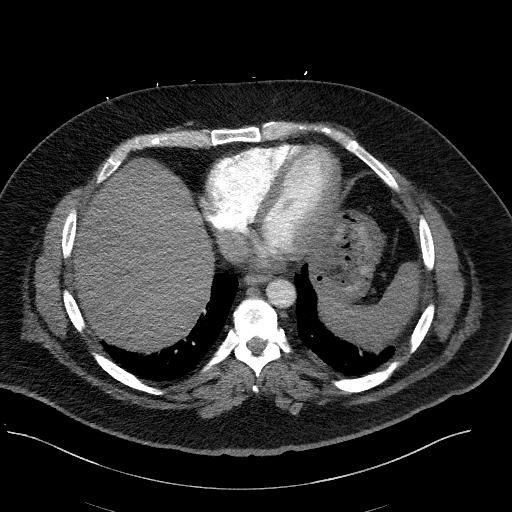
[im 21/80  lung]
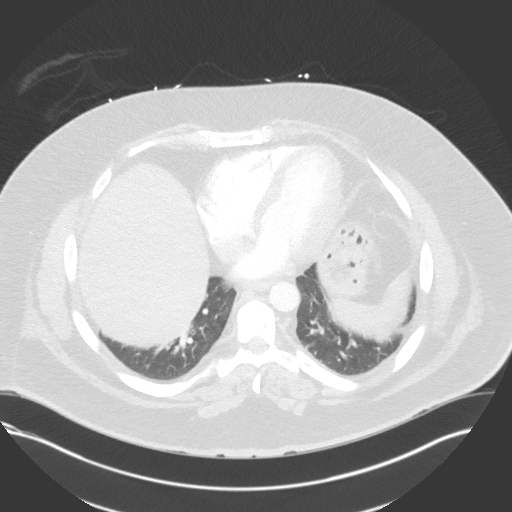
[im 24/80  mediastinal]
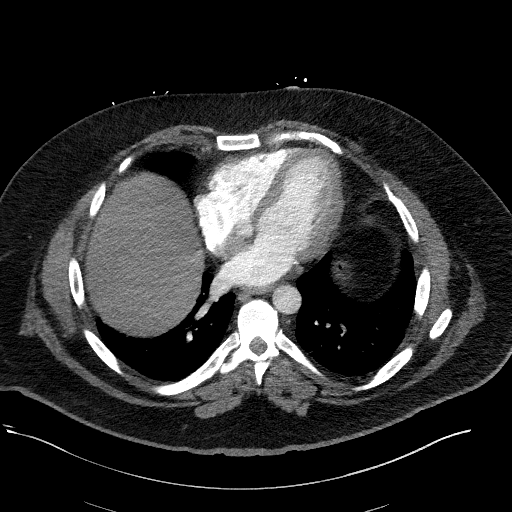
[im 30/80  lung]
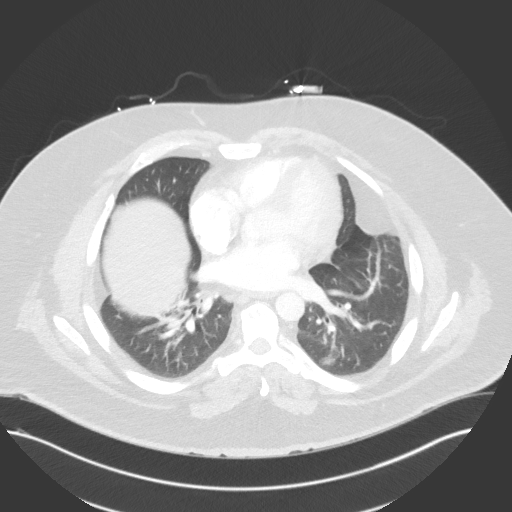
[im 33/80  mediastinal]
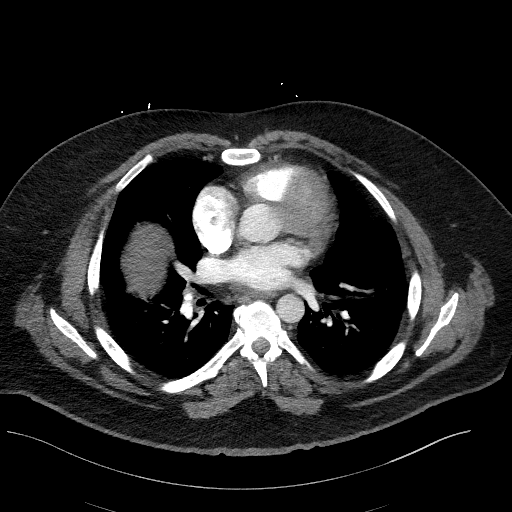
[im 39/80  lung]
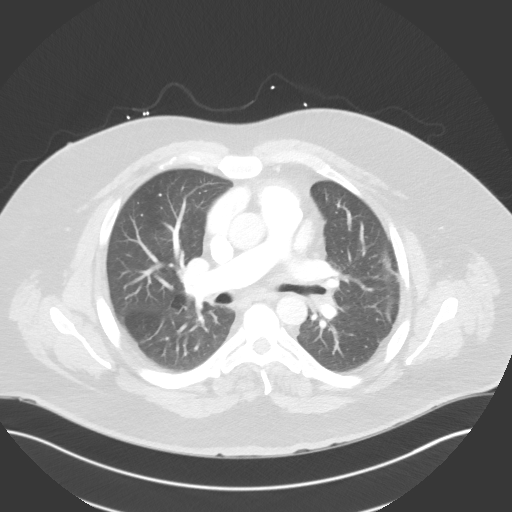
[im 41/80  mediastinal]
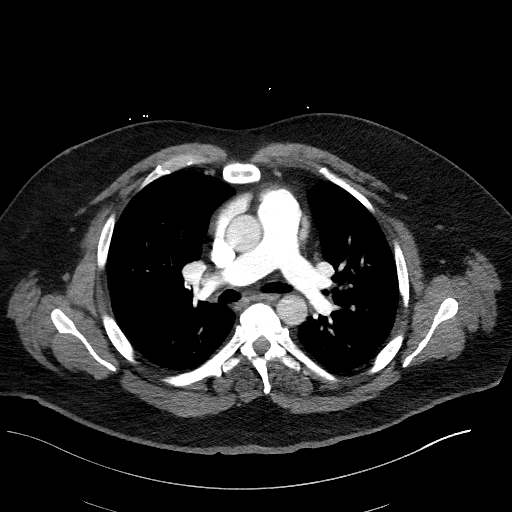
[im 47/80  lung]
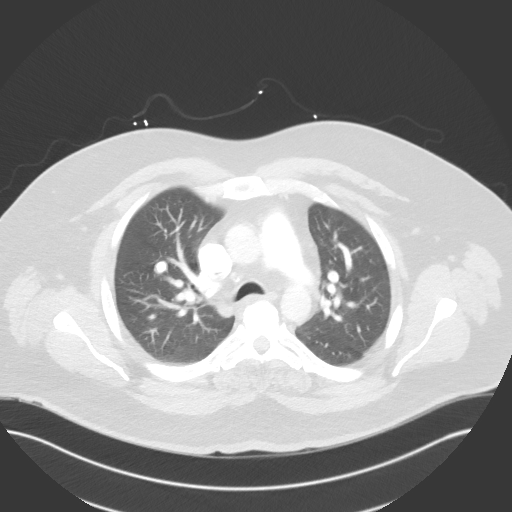
[im 50/80  mediastinal]
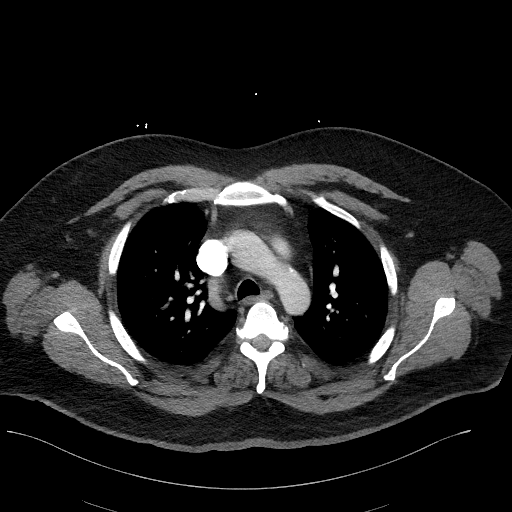
[im 56/80  lung]
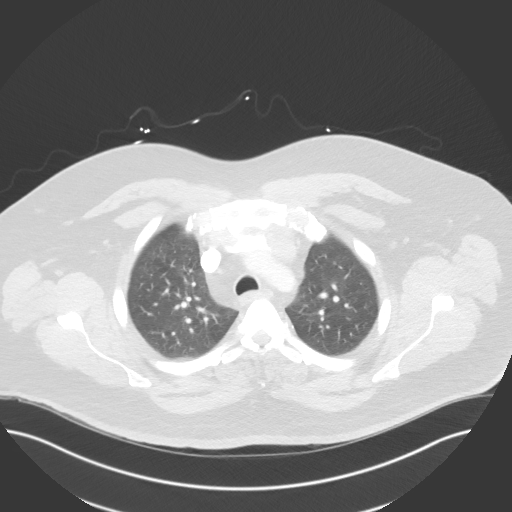
[im 59/80  mediastinal]
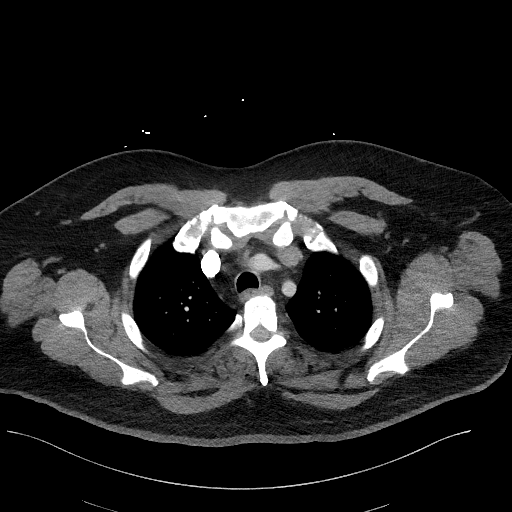
[im 62/80  lung]
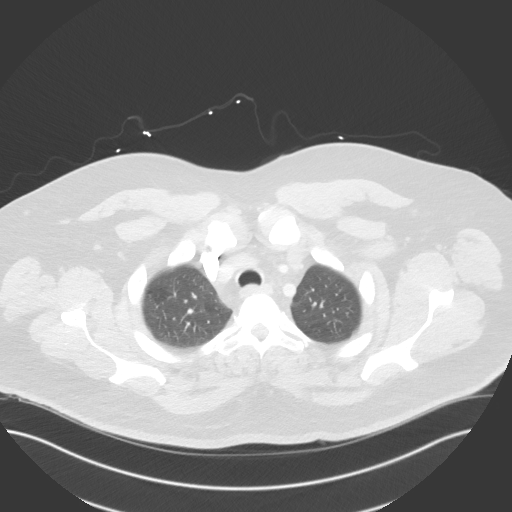
[im 68/80  mediastinal]
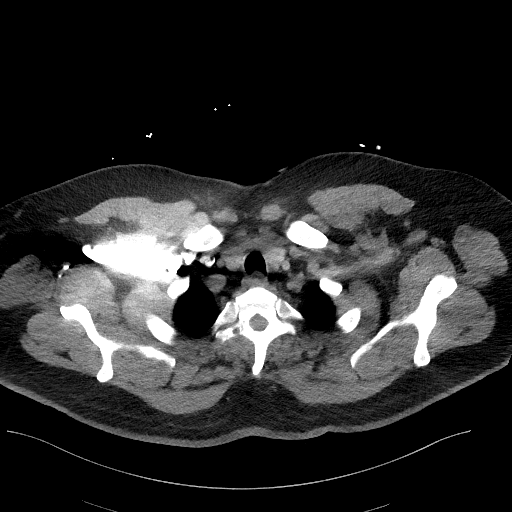
[im 71/80  lung]
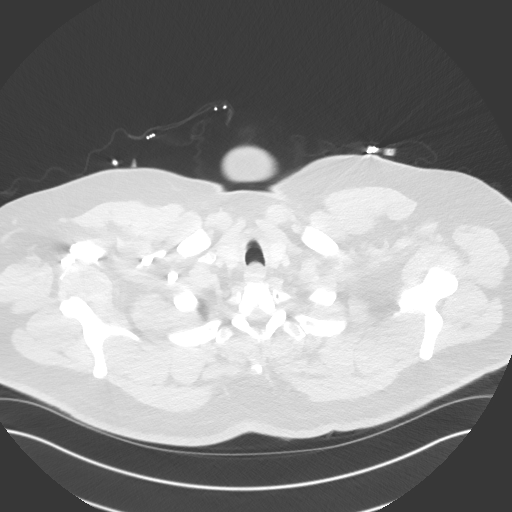
[im 77/80  mediastinal]
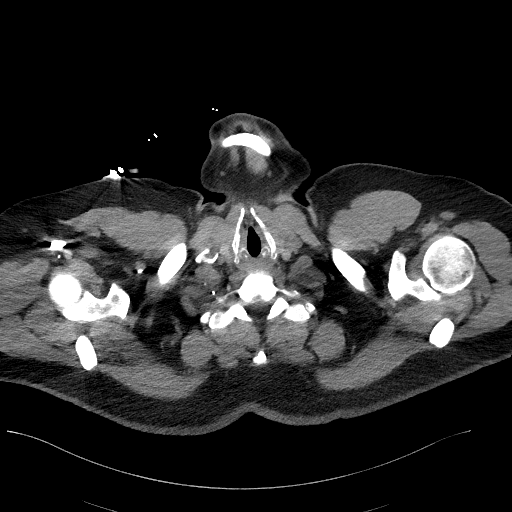

[Series 7: pe 2mm cor · coronal · 0.49mm/px · 1 of 116 slices shown]
[im 58/116  mediastinal]
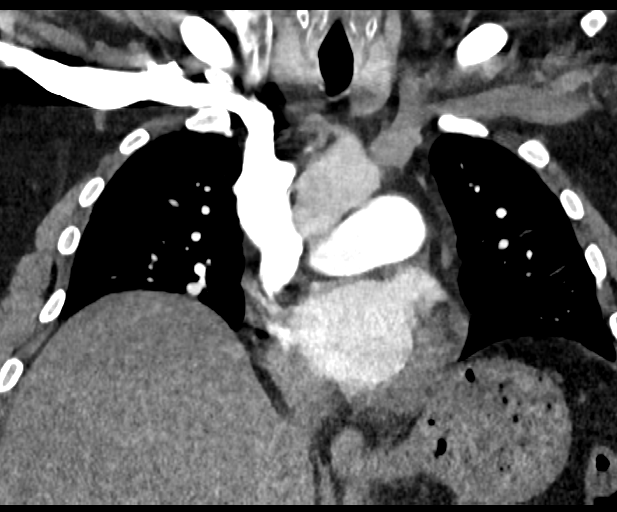

[19 of 36 positions shown; findings below may reference images not displayed]

FINDINGS: Cardiovascular: The heart size is normal. No significant pericardial
effusion is present. Minimal atherosclerotic calcifications are
present without aneurysm. There is common origin of the left common
carotid artery in the innominate artery.

Pulmonary arterial opacification is excellent. No focal filling
defects are evident to suggest pulmonary emboli.

Mediastinum/Nodes: Calcified right hilar nodes are present. No other
significant mediastinal or axillary adenopathy is present.

Lungs/Pleura: A calcified nodule in the right upper lobe measures
1.5 cm. No other focal nodule is present. There is scattered
ground-glass attenuation in both lungs, likely reflecting
atelectasis. Edema is considered less likely.

Upper Abdomen: Limited imaging of the upper abdomen is unremarkable.

Musculoskeletal: Bone windows are unremarkable. No focal lytic or
blastic lesions are present. Vertebral body heights and alignment
are normal.

Review of the MIP images confirms the above findings.
IMPRESSION: 1. No pulmonary embolus.
2. Calcified right pulmonary nodule and right hilar nodes likely
related to remote TB or granulomatous disease.
3. Low lung volumes with scattered ground-glass attenuation likely
reflecting atelectasis.
